# Patient Record
Sex: Female | Born: 1945
Health system: Southern US, Community
[De-identification: ages and names within clinical notes are randomized; demographics above are authoritative.]

## PROBLEM LIST (undated history)

## (undated) HISTORY — PX: TUBAL LIGATION: SHX77

## (undated) HISTORY — PX: BREAST BIOPSY: SHX20

## (undated) HISTORY — PX: KNEE ARTHROSCOPY: SUR90

---

## 2015-12-29 DIAGNOSIS — H8103 Meniere's disease, bilateral: Secondary | ICD-10-CM | POA: Insufficient documentation

## 2015-12-29 DIAGNOSIS — G43109 Migraine with aura, not intractable, without status migrainosus: Secondary | ICD-10-CM | POA: Insufficient documentation

## 2015-12-29 DIAGNOSIS — H903 Sensorineural hearing loss, bilateral: Secondary | ICD-10-CM | POA: Insufficient documentation

## 2015-12-29 DIAGNOSIS — G43809 Other migraine, not intractable, without status migrainosus: Secondary | ICD-10-CM | POA: Insufficient documentation

## 2016-10-03 DIAGNOSIS — H8103 Meniere's disease, bilateral: Secondary | ICD-10-CM | POA: Diagnosis not present

## 2016-10-03 DIAGNOSIS — H903 Sensorineural hearing loss, bilateral: Secondary | ICD-10-CM | POA: Diagnosis not present

## 2016-10-03 DIAGNOSIS — H905 Unspecified sensorineural hearing loss: Secondary | ICD-10-CM | POA: Diagnosis not present

## 2016-10-11 DIAGNOSIS — R42 Dizziness and giddiness: Secondary | ICD-10-CM | POA: Diagnosis not present

## 2016-10-16 DIAGNOSIS — H903 Sensorineural hearing loss, bilateral: Secondary | ICD-10-CM | POA: Diagnosis not present

## 2016-10-16 DIAGNOSIS — H8103 Meniere's disease, bilateral: Secondary | ICD-10-CM | POA: Diagnosis not present

## 2016-10-18 DIAGNOSIS — R42 Dizziness and giddiness: Secondary | ICD-10-CM | POA: Diagnosis not present

## 2016-10-25 DIAGNOSIS — R42 Dizziness and giddiness: Secondary | ICD-10-CM | POA: Diagnosis not present

## 2016-11-08 DIAGNOSIS — R42 Dizziness and giddiness: Secondary | ICD-10-CM | POA: Diagnosis not present

## 2016-11-15 DIAGNOSIS — R42 Dizziness and giddiness: Secondary | ICD-10-CM | POA: Diagnosis not present

## 2017-01-24 DIAGNOSIS — Z1231 Encounter for screening mammogram for malignant neoplasm of breast: Secondary | ICD-10-CM | POA: Diagnosis not present

## 2017-01-24 LAB — HM MAMMOGRAPHY

## 2017-02-06 DIAGNOSIS — L821 Other seborrheic keratosis: Secondary | ICD-10-CM | POA: Diagnosis not present

## 2017-02-06 DIAGNOSIS — X32XXXA Exposure to sunlight, initial encounter: Secondary | ICD-10-CM | POA: Diagnosis not present

## 2017-02-06 DIAGNOSIS — L812 Freckles: Secondary | ICD-10-CM | POA: Diagnosis not present

## 2017-02-06 DIAGNOSIS — L578 Other skin changes due to chronic exposure to nonionizing radiation: Secondary | ICD-10-CM | POA: Diagnosis not present

## 2017-02-26 DIAGNOSIS — Z139 Encounter for screening, unspecified: Secondary | ICD-10-CM | POA: Diagnosis not present

## 2017-02-26 DIAGNOSIS — Z0000 Encounter for general adult medical examination without abnormal findings: Secondary | ICD-10-CM | POA: Diagnosis not present

## 2017-02-26 DIAGNOSIS — F17201 Nicotine dependence, unspecified, in remission: Secondary | ICD-10-CM | POA: Diagnosis not present

## 2017-02-26 DIAGNOSIS — Z136 Encounter for screening for cardiovascular disorders: Secondary | ICD-10-CM | POA: Diagnosis not present

## 2017-02-26 DIAGNOSIS — Z1389 Encounter for screening for other disorder: Secondary | ICD-10-CM | POA: Diagnosis not present

## 2017-03-06 DIAGNOSIS — M9902 Segmental and somatic dysfunction of thoracic region: Secondary | ICD-10-CM | POA: Diagnosis not present

## 2017-03-06 DIAGNOSIS — M9903 Segmental and somatic dysfunction of lumbar region: Secondary | ICD-10-CM | POA: Diagnosis not present

## 2017-03-06 DIAGNOSIS — M9901 Segmental and somatic dysfunction of cervical region: Secondary | ICD-10-CM | POA: Diagnosis not present

## 2017-03-07 DIAGNOSIS — N941 Unspecified dyspareunia: Secondary | ICD-10-CM | POA: Diagnosis not present

## 2017-03-07 DIAGNOSIS — N898 Other specified noninflammatory disorders of vagina: Secondary | ICD-10-CM | POA: Diagnosis not present

## 2017-03-07 DIAGNOSIS — Z01419 Encounter for gynecological examination (general) (routine) without abnormal findings: Secondary | ICD-10-CM | POA: Diagnosis not present

## 2017-03-07 DIAGNOSIS — Z1239 Encounter for other screening for malignant neoplasm of breast: Secondary | ICD-10-CM | POA: Diagnosis not present

## 2017-03-08 DIAGNOSIS — Z01419 Encounter for gynecological examination (general) (routine) without abnormal findings: Secondary | ICD-10-CM | POA: Diagnosis not present

## 2017-03-08 DIAGNOSIS — R87615 Unsatisfactory cytologic smear of cervix: Secondary | ICD-10-CM | POA: Diagnosis not present

## 2017-03-25 DIAGNOSIS — H8103 Meniere's disease, bilateral: Secondary | ICD-10-CM | POA: Diagnosis not present

## 2017-03-28 DIAGNOSIS — M8589 Other specified disorders of bone density and structure, multiple sites: Secondary | ICD-10-CM | POA: Diagnosis not present

## 2017-03-28 DIAGNOSIS — H52223 Regular astigmatism, bilateral: Secondary | ICD-10-CM | POA: Diagnosis not present

## 2017-03-28 DIAGNOSIS — Z0000 Encounter for general adult medical examination without abnormal findings: Secondary | ICD-10-CM | POA: Diagnosis not present

## 2017-03-28 DIAGNOSIS — M810 Age-related osteoporosis without current pathological fracture: Secondary | ICD-10-CM | POA: Diagnosis not present

## 2017-03-28 DIAGNOSIS — Z136 Encounter for screening for cardiovascular disorders: Secondary | ICD-10-CM | POA: Diagnosis not present

## 2017-03-28 DIAGNOSIS — R935 Abnormal findings on diagnostic imaging of other abdominal regions, including retroperitoneum: Secondary | ICD-10-CM | POA: Diagnosis not present

## 2017-03-28 DIAGNOSIS — H2513 Age-related nuclear cataract, bilateral: Secondary | ICD-10-CM | POA: Diagnosis not present

## 2017-03-28 DIAGNOSIS — H524 Presbyopia: Secondary | ICD-10-CM | POA: Diagnosis not present

## 2017-03-28 DIAGNOSIS — H5202 Hypermetropia, left eye: Secondary | ICD-10-CM | POA: Diagnosis not present

## 2017-03-28 LAB — HM DEXA SCAN

## 2017-04-01 DIAGNOSIS — H905 Unspecified sensorineural hearing loss: Secondary | ICD-10-CM | POA: Diagnosis not present

## 2017-04-01 DIAGNOSIS — H8103 Meniere's disease, bilateral: Secondary | ICD-10-CM | POA: Diagnosis not present

## 2017-04-01 DIAGNOSIS — H903 Sensorineural hearing loss, bilateral: Secondary | ICD-10-CM | POA: Diagnosis not present

## 2017-04-12 DIAGNOSIS — H8103 Meniere's disease, bilateral: Secondary | ICD-10-CM | POA: Diagnosis not present

## 2017-06-18 DIAGNOSIS — H9190 Unspecified hearing loss, unspecified ear: Secondary | ICD-10-CM | POA: Diagnosis not present

## 2017-06-18 DIAGNOSIS — H8109 Meniere's disease, unspecified ear: Secondary | ICD-10-CM | POA: Diagnosis not present

## 2017-06-18 DIAGNOSIS — H8149 Vertigo of central origin, unspecified ear: Secondary | ICD-10-CM | POA: Diagnosis not present

## 2017-07-30 DIAGNOSIS — H903 Sensorineural hearing loss, bilateral: Secondary | ICD-10-CM | POA: Diagnosis not present

## 2017-07-30 DIAGNOSIS — H8103 Meniere's disease, bilateral: Secondary | ICD-10-CM | POA: Diagnosis not present

## 2017-07-30 DIAGNOSIS — R42 Dizziness and giddiness: Secondary | ICD-10-CM | POA: Diagnosis not present

## 2017-07-30 DIAGNOSIS — H818X3 Other disorders of vestibular function, bilateral: Secondary | ICD-10-CM | POA: Diagnosis not present

## 2018-01-03 ENCOUNTER — Ambulatory Visit (INDEPENDENT_AMBULATORY_CARE_PROVIDER_SITE_OTHER): Payer: Medicare Other | Admitting: Physician Assistant

## 2018-01-03 ENCOUNTER — Encounter: Payer: Self-pay | Admitting: Physician Assistant

## 2018-01-03 VITALS — BP 122/68 | HR 64 | Temp 98.1°F | Resp 16 | Ht 65.0 in | Wt 121.0 lb

## 2018-01-03 DIAGNOSIS — H8109 Meniere's disease, unspecified ear: Secondary | ICD-10-CM

## 2018-01-03 DIAGNOSIS — Z1231 Encounter for screening mammogram for malignant neoplasm of breast: Secondary | ICD-10-CM | POA: Diagnosis not present

## 2018-01-03 DIAGNOSIS — G43109 Migraine with aura, not intractable, without status migrainosus: Secondary | ICD-10-CM

## 2018-01-03 DIAGNOSIS — Z1239 Encounter for other screening for malignant neoplasm of breast: Secondary | ICD-10-CM

## 2018-01-03 DIAGNOSIS — G43809 Other migraine, not intractable, without status migrainosus: Secondary | ICD-10-CM

## 2018-01-03 NOTE — Progress Notes (Signed)
       Patient: Christina Parsons Female    DOB: [birth year]   72 y.o.   MRN: [id number] Visit Date: 01/09/2018  Today's Provider: Trinna Post, PA-C   Chief Complaint  Patient presents with  . Establish Care   Subjective:    HPI   Christina Parsons is a 72 y/o woman presenting today to establish care. She is originally from [place] but recently moved from [facility] [place] to [place].  She has had Meniere's disease for 11 years, previously treated at [facility] and now at [facility]. She is currently on Dyazide 37.5-25 mg for this with [facility]. She has previously undergone steroid injections and vestibular rehab.   She has had recent colonoscopy and DEXA scan. She reports she is due for a mammogram. She believes she had a pneumonia shot from prior PCP.     Allergies not on file   Current Outpatient Medications:  .  Biotin 1 MG CAPS, biotin, Disp: , Rfl:  .  triamterene-hydrochlorothiazide (DYAZIDE) 37.5-25 MG capsule, Take by mouth., Disp: , Rfl:   Review of Systems   12 point ROS reviewed and is negative except for pertinent info in HPI.  Social History   Tobacco Use  . Smoking status: Never Smoker  . Smokeless tobacco: Never Used  Substance Use Topics  . Alcohol use: Never    Frequency: Never   Objective:   BP 122/68 (BP Location: Right Arm, Patient Position: Sitting, Cuff Size: Normal)   Pulse 64   Temp 98.1 F (36.7 C) (Oral)   Resp 16   Ht 5\' 5"  (1.651 m)   Wt 121 lb (54.9 kg)   BMI 20.14 kg/m  Vitals:   01/03/18 1511  BP: 122/68  Pulse: 64  Resp: 16  Temp: 98.1 F (36.7 C)  TempSrc: Oral  Weight: 121 lb (54.9 kg)  Height: 5\' 5"  (1.651 m)     Physical Exam  Constitutional: She is oriented to person, place, and time. She appears well-developed and well-nourished.  Cardiovascular: Normal rate and regular rhythm.  Pulmonary/Chest: Effort normal and breath sounds normal.  Abdominal: Soft. Bowel sounds are normal.  Neurological: She is alert and oriented to person,  place, and time.  Skin: Skin is warm and dry.  Psychiatric: She has a normal mood and affect. Her behavior is normal.        Assessment & Plan:     1. Breast cancer screening  Requesting records from previous PCP regarding colonoscopy, DEXA, and PNA shot.   - MM Digital Screening; Future  2. Vestibular migraine  Treated by [facility] for Meniere's.  Return in about 1 year (around 01/04/2019) for AWV.  The entirety of the information documented in the History of Present Illness, Review of Systems and Physical Exam were personally obtained by me. Portions of this information were initially documented by Ashley Royalty, CMA and reviewed by me for thoroughness and accuracy.        Trinna Post, PA-C  [facility]

## 2018-01-03 NOTE — Patient Instructions (Signed)

## 2018-02-03 ENCOUNTER — Encounter (HOSPITAL_COMMUNITY): Payer: Self-pay

## 2018-02-03 ENCOUNTER — Ambulatory Visit
Admission: RE | Admit: 2018-02-03 | Discharge: 2018-02-03 | Disposition: A | Discharge status: Home / Self Care | Payer: Medicare Other | Source: Ambulatory Visit | Attending: Physician Assistant | Admitting: Physician Assistant

## 2018-02-03 DIAGNOSIS — Z1239 Encounter for other screening for malignant neoplasm of breast: Secondary | ICD-10-CM

## 2018-02-03 DIAGNOSIS — Z1231 Encounter for screening mammogram for malignant neoplasm of breast: Secondary | ICD-10-CM | POA: Insufficient documentation

## 2018-02-18 DIAGNOSIS — H8103 Meniere's disease, bilateral: Secondary | ICD-10-CM | POA: Diagnosis not present

## 2018-02-18 DIAGNOSIS — H903 Sensorineural hearing loss, bilateral: Secondary | ICD-10-CM | POA: Diagnosis not present

## 2018-02-20 ENCOUNTER — Encounter: Payer: Self-pay | Admitting: Physician Assistant

## 2018-03-03 DIAGNOSIS — L814 Other melanin hyperpigmentation: Secondary | ICD-10-CM | POA: Diagnosis not present

## 2018-03-03 DIAGNOSIS — L578 Other skin changes due to chronic exposure to nonionizing radiation: Secondary | ICD-10-CM | POA: Diagnosis not present

## 2018-03-03 DIAGNOSIS — L570 Actinic keratosis: Secondary | ICD-10-CM | POA: Diagnosis not present

## 2018-03-03 DIAGNOSIS — L812 Freckles: Secondary | ICD-10-CM | POA: Diagnosis not present

## 2018-03-03 DIAGNOSIS — X32XXXA Exposure to sunlight, initial encounter: Secondary | ICD-10-CM | POA: Diagnosis not present

## 2018-03-06 ENCOUNTER — Ambulatory Visit (INDEPENDENT_AMBULATORY_CARE_PROVIDER_SITE_OTHER): Payer: Medicare Other | Admitting: Physician Assistant

## 2018-03-06 ENCOUNTER — Encounter: Payer: Self-pay | Admitting: Physician Assistant

## 2018-03-06 VITALS — BP 112/60 | HR 73 | Temp 97.7°F | Resp 16 | Wt 121.0 lb

## 2018-03-06 DIAGNOSIS — J0140 Acute pansinusitis, unspecified: Secondary | ICD-10-CM | POA: Diagnosis not present

## 2018-03-06 MED ORDER — AZITHROMYCIN 250 MG PO TABS: ORAL_TABLET | ORAL | 0 refills | Status: DC

## 2018-03-06 NOTE — Progress Notes (Signed)
Patient: Christina Parsons Female    DOB: 1945/10/07   72 y.o.   MRN: [id number] Visit Date: 03/06/2018  Today's Provider: Mar Daring, PA-C   Chief Complaint  Patient presents with  . Cough   Subjective:    Cough  This is a new problem. The current episode started 1 to 4 weeks ago. The problem has been gradually worsening. The problem occurs constantly. The cough is non-productive ("Dry cough"). Associated symptoms include postnasal drip. Pertinent negatives include no chills, ear congestion, ear pain, fever, nasal congestion, rhinorrhea, shortness of breath or wheezing. The symptoms are aggravated by lying down (Talking). She has tried OTC cough suppressant (nasal decongestant,Claritin,Mucinex.) for the symptoms. The treatment provided no relief.      Allergies  Allergen Reactions  . Levofloxacin     Other reaction(s): Other (See Comments) Headache, body aches, diarrhea Headache, body aches, diarrhea   . Codeine     Other reaction(s): Dizziness, Dizziness (intolerance) Vertigo Vertigo      Current Outpatient Medications:  .  Biotin 1 MG CAPS, biotin, Disp: , Rfl:  .  Calcium-Magnesium-Vitamin D (CALCIUM 500 PO), Take by mouth daily. , Disp: , Rfl:  .  Multiple Vitamins-Minerals (OCUVITE PO), Take by mouth., Disp: , Rfl:  .  triamterene-hydrochlorothiazide (DYAZIDE) 37.5-25 MG capsule, Take by mouth., Disp: , Rfl:   Review of Systems  Constitutional: Negative for chills and fever.  HENT: Positive for congestion and postnasal drip. Negative for ear pain and rhinorrhea.   Respiratory: Positive for cough. Negative for shortness of breath and wheezing.   Gastrointestinal: Negative.   Neurological: Negative.     Social History   Tobacco Use  . Smoking status: Never Smoker  . Smokeless tobacco: Never Used  Substance Use Topics  . Alcohol use: Never    Frequency: Never   Objective:   BP 112/60 (BP Location: Left Arm, Patient Position: Sitting, Cuff Size:  Normal)   Pulse 73   Temp 97.7 F (36.5 C) (Oral)   Resp 16   Wt 121 lb (54.9 kg)   SpO2 96%   BMI 20.14 kg/m    Physical Exam  Constitutional: She appears well-developed and well-nourished. No distress.  HENT:  Head: Normocephalic and atraumatic.  Right Ear: Hearing, tympanic membrane, external ear and ear canal normal.  Left Ear: Hearing, tympanic membrane, external ear and ear canal normal.  Nose: Right sinus exhibits maxillary sinus tenderness and frontal sinus tenderness. Left sinus exhibits maxillary sinus tenderness and frontal sinus tenderness.  Mouth/Throat: Uvula is midline, oropharynx is clear and moist and mucous membranes are normal. No oropharyngeal exudate.  Neck: Normal range of motion. Neck supple. No tracheal deviation present. No thyromegaly present.  Cardiovascular: Normal rate, regular rhythm and normal heart sounds. Exam reveals no gallop and no friction rub.  No murmur heard. Pulmonary/Chest: Effort normal and breath sounds normal. No stridor. No respiratory distress. She has no wheezes. She has no rales.  Lymphadenopathy:    She has no cervical adenopathy.  Skin: She is not diaphoretic.  Vitals reviewed.      Assessment & Plan:     1. Acute pansinusitis, recurrence not specified Worsening symptoms that have not responded to OTC medications. Will give Zpak as below. Continue allergy medications. Stay well hydrated and get plenty of rest. Call if no symptom improvement or if symptoms worsen. - azithromycin (ZITHROMAX) 250 MG tablet; Take 2 tablets PO on day one, and one tablet PO daily thereafter until  completed.  Dispense: 6 tablet; Refill: 0       Mar Daring, PA-C  [facility]

## 2018-03-06 NOTE — Patient Instructions (Signed)

## 2018-06-13 DIAGNOSIS — H2513 Age-related nuclear cataract, bilateral: Secondary | ICD-10-CM | POA: Diagnosis not present

## 2018-09-06 IMAGING — MG MM DIGITAL SCREENING BILAT W/ CAD
4 series · 4 of 4 positions shown · non-contrast
Comparison: None.

CLINICAL DATA: Screening.

EXAM:
DIGITAL SCREENING BILATERAL MAMMOGRAM WITH CAD

[L MLO]
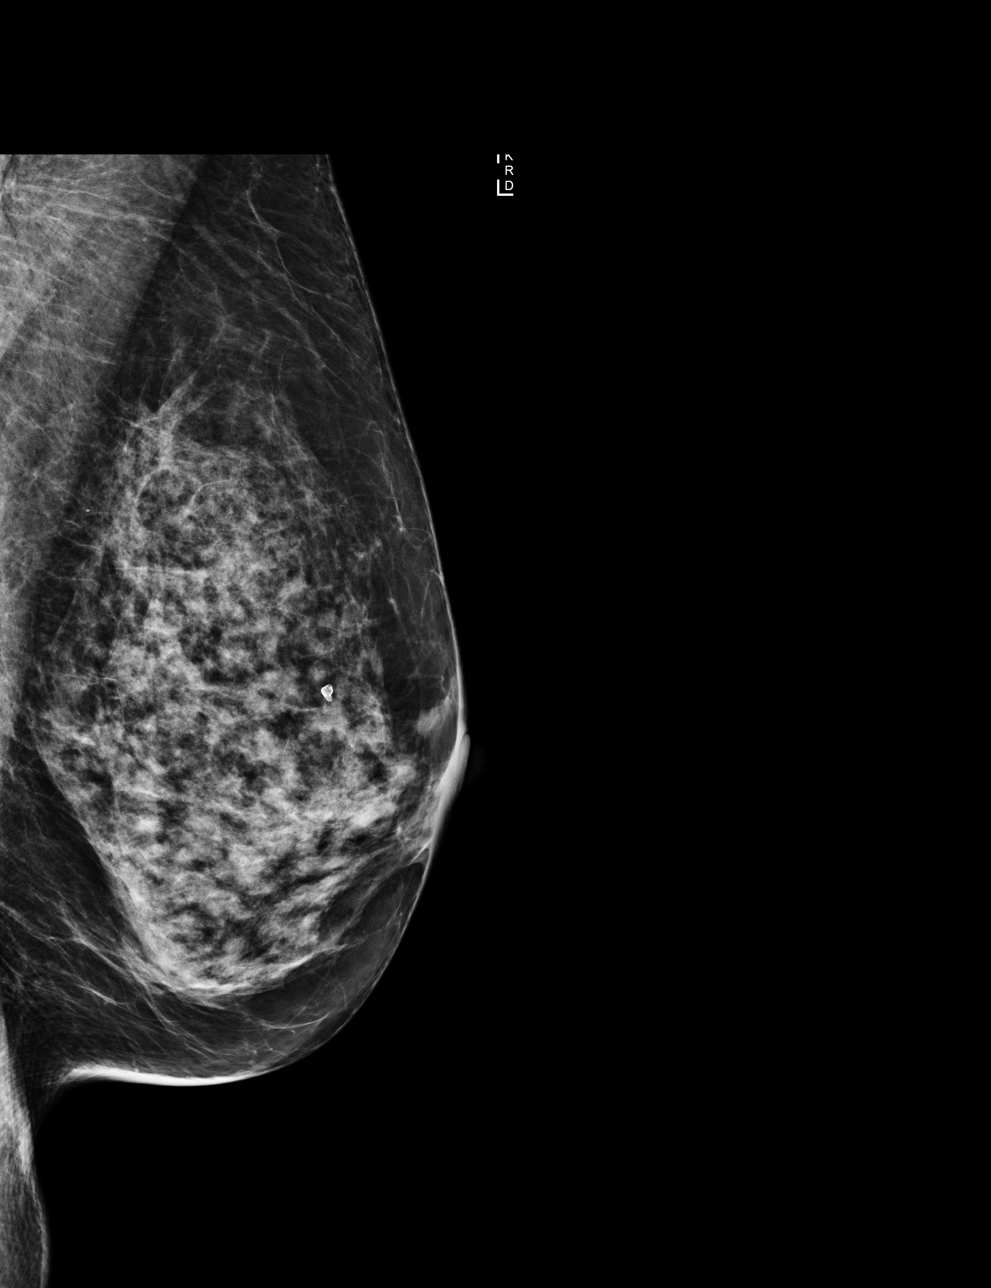

[R MLO]
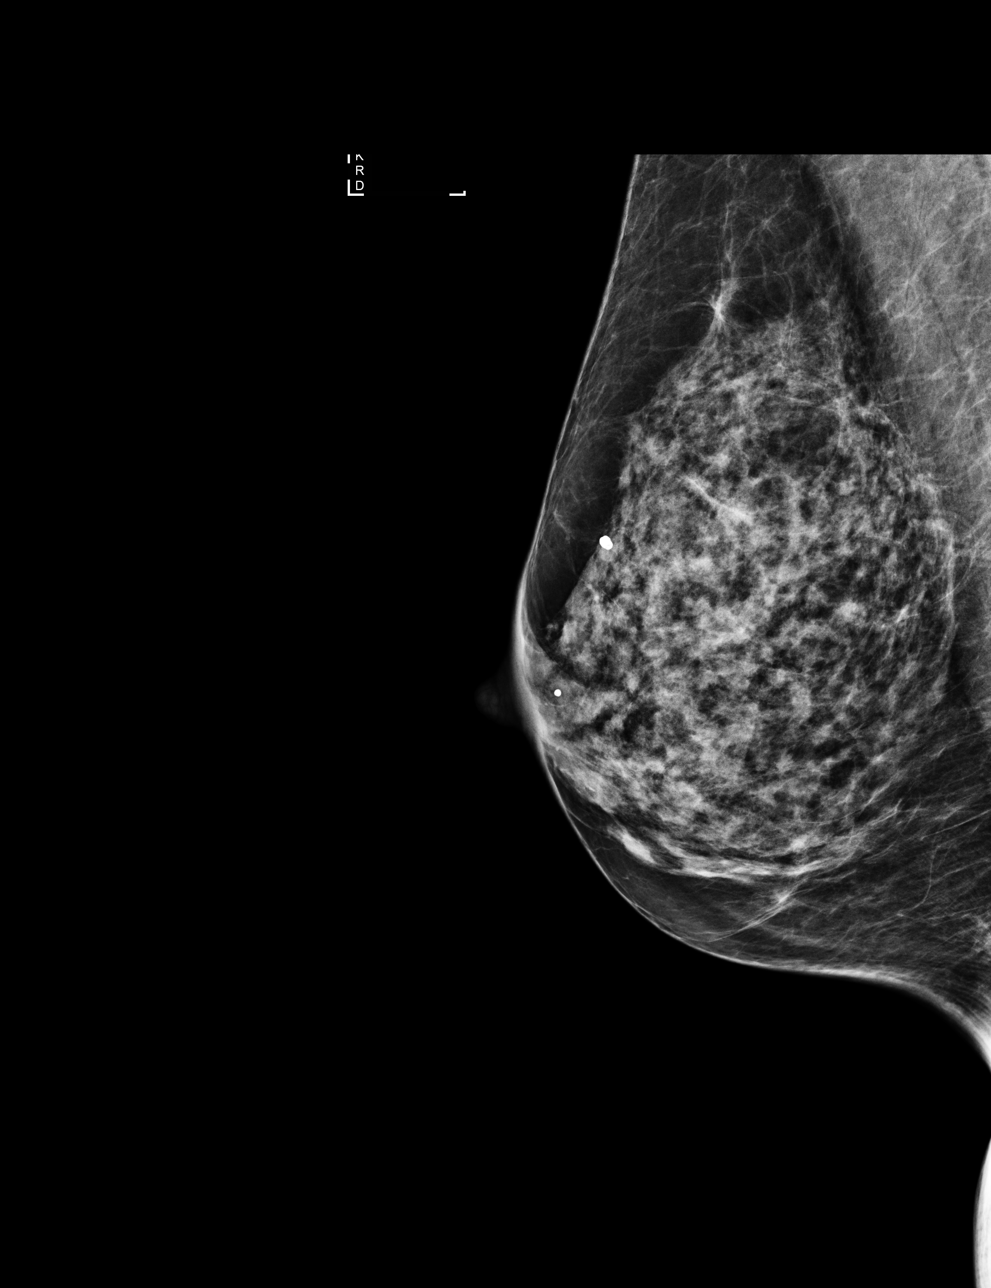

[L CC]
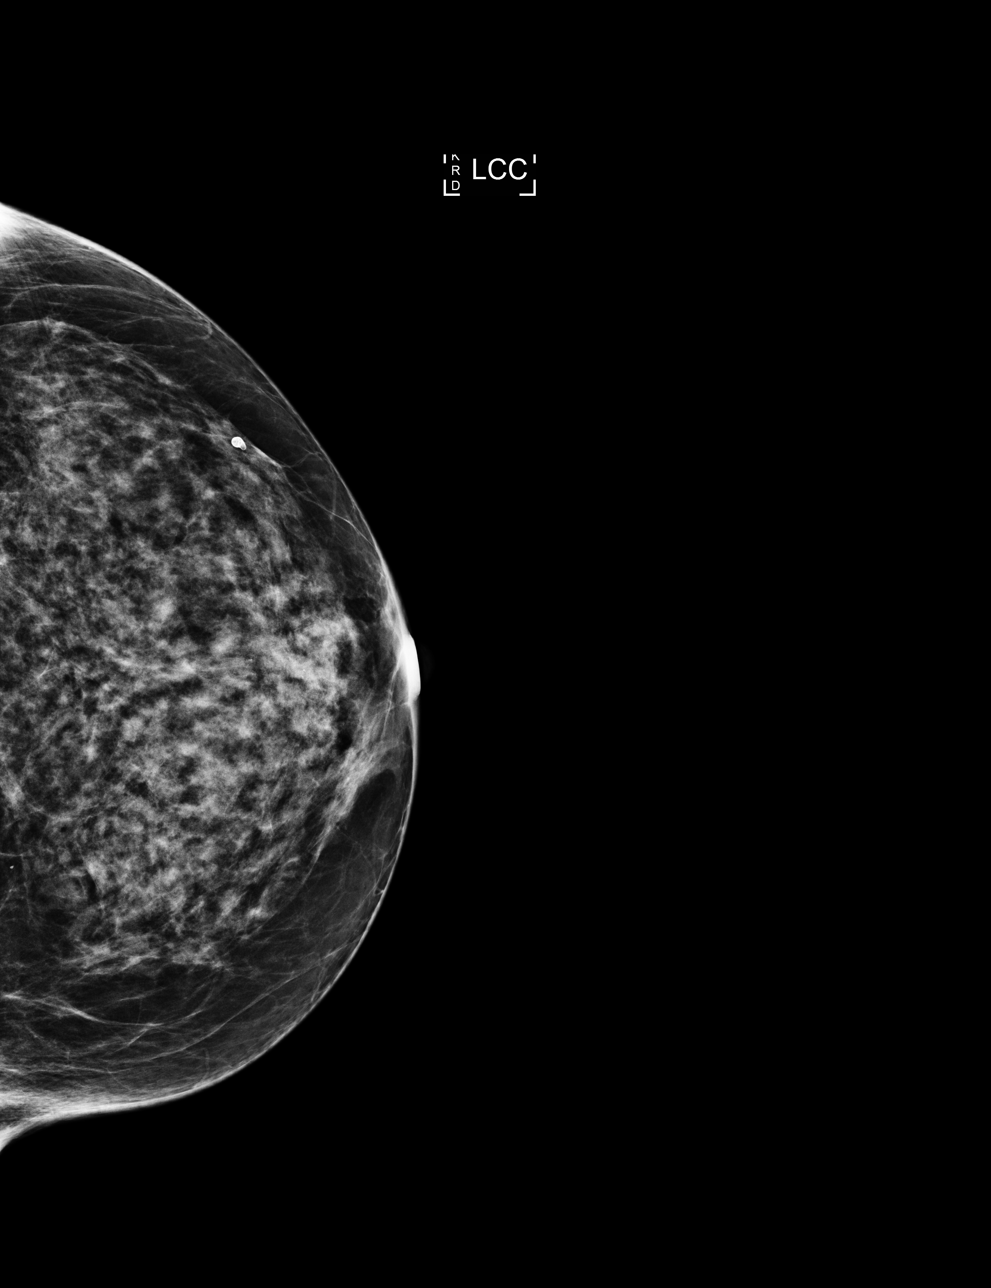

[R CC]
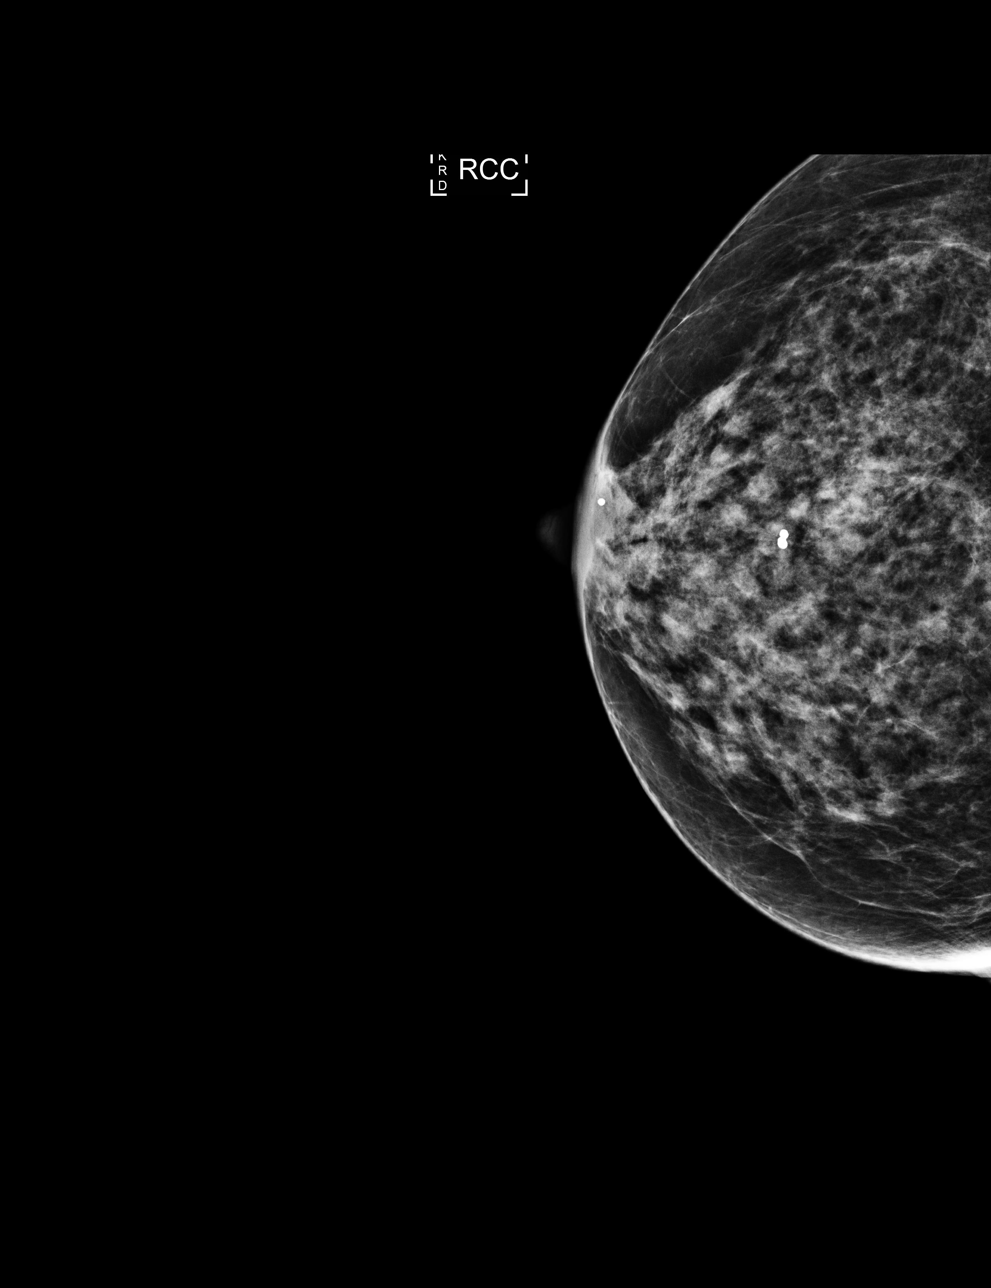

[4 of 4 positions shown; findings below may reference images not displayed]

ACR Breast Density Category c: The breast tissue is heterogeneously
dense, which may obscure small masses
FINDINGS: There are no findings suspicious for malignancy. Images were
processed with CAD.
IMPRESSION: No mammographic evidence of malignancy. A result letter of this
screening mammogram will be mailed directly to the patient.

RECOMMENDATION:
Screening mammogram in one year. (Code:U2-0-761)

BI-RADS CATEGORY  1: Negative.

## 2018-11-18 ENCOUNTER — Ambulatory Visit (INDEPENDENT_AMBULATORY_CARE_PROVIDER_SITE_OTHER): Payer: Medicare Other | Admitting: Physician Assistant

## 2018-11-18 ENCOUNTER — Encounter: Payer: Self-pay | Admitting: Physician Assistant

## 2018-11-18 VITALS — BP 113/72 | HR 82 | Temp 98.2°F | Wt 122.6 lb

## 2018-11-18 DIAGNOSIS — R350 Frequency of micturition: Secondary | ICD-10-CM

## 2018-11-18 DIAGNOSIS — R300 Dysuria: Secondary | ICD-10-CM | POA: Diagnosis not present

## 2018-11-18 DIAGNOSIS — R3989 Other symptoms and signs involving the genitourinary system: Secondary | ICD-10-CM | POA: Diagnosis not present

## 2018-11-18 LAB — POCT URINALYSIS DIPSTICK
Bilirubin, UA: NEGATIVE
Blood, UA: NEGATIVE
Glucose, UA: NEGATIVE
Ketones, UA: NEGATIVE
Leukocytes, UA: NEGATIVE
Nitrite, UA: NEGATIVE
Protein, UA: NEGATIVE
Spec Grav, UA: <=1.005 — AB (ref 1.010–1.025)
Urobilinogen, UA: 0.2 E.U./dL
pH, UA: 7.5 (ref 5.0–8.0)

## 2018-11-18 NOTE — Progress Notes (Signed)
Patient: Christina Parsons Female    DOB: Apr 26, 1946   73 y.o.   MRN: [id number] Visit Date: 11/18/2018  Today's Provider: Trinna Post, PA-C   Chief Complaint  Patient presents with  . Dysuria   Subjective:     Dysuria   The current episode started 1 to 4 weeks ago. The problem occurs every urination. The problem has been gradually improving. The pain is at a severity of 0/10. There has been no fever. Associated symptoms include urgency. She has tried nothing for the symptoms.  Patient states that her urine was cloudy last week but has cleared up.  Allergies  Allergen Reactions  . Levofloxacin     Other reaction(s): Other (See Comments) Headache, body aches, diarrhea Headache, body aches, diarrhea   . Codeine     Other reaction(s): Dizziness, Dizziness (intolerance) Vertigo Vertigo      Current Outpatient Medications:  .  Biotin 1 MG CAPS, biotin, Disp: , Rfl:  .  Calcium-Magnesium-Vitamin D (CALCIUM 500 PO), Take by mouth daily. , Disp: , Rfl:  .  Multiple Vitamins-Minerals (OCUVITE PO), Take by mouth., Disp: , Rfl:  .  triamterene-hydrochlorothiazide (DYAZIDE) 37.5-25 MG capsule, Take by mouth., Disp: , Rfl:  .  azithromycin (ZITHROMAX) 250 MG tablet, Take 2 tablets PO on day one, and one tablet PO daily thereafter until completed. (Patient not taking: Reported on 11/18/2018), Disp: 6 tablet, Rfl: 0  Review of Systems  HENT: Negative.   Respiratory: Negative.   Endocrine: Negative.   Genitourinary: Positive for dysuria and urgency.  Skin: Negative.   Neurological: Negative.     Social History   Tobacco Use  . Smoking status: Never Smoker  . Smokeless tobacco: Never Used  Substance Use Topics  . Alcohol use: Never    Frequency: Never      Objective:   BP 113/72 (BP Location: Left Arm, Patient Position: Sitting, Cuff Size: Normal)   Pulse 82   Temp 98.2 F (36.8 C) (Oral)   Wt 122 lb 9.6 oz (55.6 kg)   SpO2 96%   BMI 20.40 kg/m  Vitals:   11/18/18 1436  BP: 113/72  Pulse: 82  Temp: 98.2 F (36.8 C)  TempSrc: Oral  SpO2: 96%  Weight: 122 lb 9.6 oz (55.6 kg)     Physical Exam Constitutional:      General: She is not in acute distress.    Appearance: She is well-developed. She is not diaphoretic.  Cardiovascular:     Rate and Rhythm: Normal rate and regular rhythm.  Pulmonary:     Effort: Pulmonary effort is normal.     Breath sounds: Normal breath sounds.  Abdominal:     General: Bowel sounds are normal. There is no distension.     Palpations: Abdomen is soft.     Tenderness: There is no abdominal tenderness. There is no guarding or rebound.  Skin:    General: Skin is warm and dry.  Neurological:     Mental Status: She is alert and oriented to person, place, and time.  Psychiatric:        Behavior: Behavior normal.         Assessment & Plan    1. Possible urinary tract infection  Specific gravity very low, no nitrites or leuks. Very dilute. Will send for culture, defer antibiotics until then.   - POCT urinalysis dipstick  2. Dysuria  - CULTURE, URINE COMPREHENSIVE  3. Urinary frequency  The entirety of  the information documented in the History of Present Illness, Review of Systems and Physical Exam were personally obtained by me. Portions of this information were initially documented by [facility], CMA and reviewed by me for thoroughness and accuracy.   Return if symptoms worsen or fail to improve.         Trinna Post, PA-C  [facility]

## 2018-11-18 NOTE — Patient Instructions (Signed)

## 2018-11-20 ENCOUNTER — Telehealth: Payer: Self-pay | Admitting: Physician Assistant

## 2018-11-20 DIAGNOSIS — N3090 Cystitis, unspecified without hematuria: Secondary | ICD-10-CM

## 2018-11-20 MED ORDER — SULFAMETHOXAZOLE-TRIMETHOPRIM 800-160 MG PO TABS: 1.0000 | ORAL_TABLET | Freq: Two times a day (BID) | ORAL | 0 refills | Status: AC

## 2018-11-20 NOTE — Telephone Encounter (Signed)
Pt was in Tuesday with what she thought was a uti.  She is still having symptoms and would like an antibiotic sent to the pharmacy  [place]   CB#  [phone]  Thanks Con Memos

## 2018-11-21 ENCOUNTER — Telehealth: Payer: Self-pay

## 2018-11-21 LAB — CULTURE, URINE COMPREHENSIVE

## 2018-11-21 NOTE — Telephone Encounter (Signed)
Patient advised as below.  

## 2018-11-21 NOTE — Telephone Encounter (Signed)
[email or web address]

## 2018-11-21 NOTE — Telephone Encounter (Signed)
Patient advised as below. Patient verbalizes understanding and is in agreement with treatment plan.  

## 2018-11-21 NOTE — Telephone Encounter (Signed)
-----   Message from Trinna Post, [place] sent at 11/21/2018  1:57 PM EST ----- Urine culture shows e coli sensitive to bactrim prescribed. Please finish abx.

## 2019-03-10 DIAGNOSIS — H903 Sensorineural hearing loss, bilateral: Secondary | ICD-10-CM | POA: Diagnosis not present

## 2019-03-10 DIAGNOSIS — R42 Dizziness and giddiness: Secondary | ICD-10-CM | POA: Diagnosis not present

## 2019-03-10 DIAGNOSIS — H8103 Meniere's disease, bilateral: Secondary | ICD-10-CM | POA: Diagnosis not present

## 2019-03-10 DIAGNOSIS — H9313 Tinnitus, bilateral: Secondary | ICD-10-CM | POA: Diagnosis not present

## 2019-03-19 ENCOUNTER — Other Ambulatory Visit: Payer: Self-pay

## 2019-03-19 ENCOUNTER — Ambulatory Visit (INDEPENDENT_AMBULATORY_CARE_PROVIDER_SITE_OTHER): Payer: Medicare Other | Admitting: Physician Assistant

## 2019-03-19 ENCOUNTER — Encounter: Payer: Self-pay | Admitting: Physician Assistant

## 2019-03-19 VITALS — BP 101/59 | HR 54 | Temp 98.2°F | Resp 16 | Ht 65.0 in | Wt 120.0 lb

## 2019-03-19 DIAGNOSIS — Z1239 Encounter for other screening for malignant neoplasm of breast: Secondary | ICD-10-CM

## 2019-03-19 DIAGNOSIS — G43109 Migraine with aura, not intractable, without status migrainosus: Secondary | ICD-10-CM

## 2019-03-19 DIAGNOSIS — H903 Sensorineural hearing loss, bilateral: Secondary | ICD-10-CM

## 2019-03-19 NOTE — Patient Instructions (Signed)

## 2019-03-19 NOTE — Progress Notes (Signed)
Patient: Christina Parsons, Female    DOB: Nov 13, 1945, 73 y.o.   MRN: [id number] Visit Date: 03/19/2019  Today's Provider: Trinna Post, PA-C   Chief Complaint  Patient presents with  . Medicare Wellness   Subjective:     Annual physical exam Reese L Glasser is a 73 y.o. female who presents today for health maintenance and complete physical. She feels well. She reports exercising daily. She reports she is sleeping well.  She is planning to move back to [facility], where she originally moved from. Daughter just got job at [employer].  Continues to have Meniere's disease and is maintained on diuretic.   Due for mammogram,  02/20/2018 mammogram.   She reports colonoscopy in [facility], thought we do not have records available.  -----------------------------------------------------------------   Review of Systems  Constitutional: Negative.   HENT: Positive for hearing loss and tinnitus.   Eyes: Negative.   Respiratory: Negative.   Cardiovascular: Negative.   Gastrointestinal: Negative.   Endocrine: Negative.   Genitourinary: Negative.   Musculoskeletal: Negative.   Skin: Negative.   Allergic/Immunologic: Negative.   Neurological: Negative.   Hematological: Negative.   Psychiatric/Behavioral: Negative.     Social History      She  reports that she has never smoked. She has never used smokeless tobacco. She reports that she does not drink alcohol or use drugs.       Social History   Socioeconomic History  . Marital status: Divorced    Spouse name: Not on file  . Number of children: Not on file  . Years of education: Not on file  . Highest education level: Not on file  Occupational History  . Not on file  Social Needs  . Financial resource strain: Not on file  . Food insecurity    Worry: Not on file    Inability: Not on file  . Transportation needs    Medical: Not on file    Non-medical: Not on file  Tobacco Use  . Smoking status: Never Smoker  . Smokeless  tobacco: Never Used  Substance and Sexual Activity  . Alcohol use: Never    Frequency: Never  . Drug use: Never  . Sexual activity: Never  Lifestyle  . Physical activity    Days per week: Not on file    Minutes per session: Not on file  . Stress: Not on file  Relationships  . Social Herbalist on phone: Not on file    Gets together: Not on file    Attends religious service: Not on file    Active member of club or organization: Not on file    Attends meetings of clubs or organizations: Not on file    Relationship status: Not on file  Other Topics Concern  . Not on file  Social History Narrative  . Not on file    No past medical history on file.   Patient Active Problem List   Diagnosis Date Noted  . 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 Migraine with vertigo 12/29/2015  . Sensorineural hearing loss of both ears 12/29/2015  . Vestibular migraine 12/29/2015    Past Surgical History:  Procedure Laterality Date  . BREAST BIOPSY Left    core- neg  . KNEE ARTHROSCOPY Right   . TUBAL LIGATION      Family History        Family Status  Relation Name Status  . Mother  Alive  .  Father  Deceased at age 57  . Neg Hx  (Not Specified)        Her family history includes Alzheimer's disease in her father; Cataracts in her mother; Hypertension in her mother. There is no history of Breast cancer.      Allergies  Allergen Reactions  . Levofloxacin     Other reaction(s): Other (See Comments) Headache, body aches, diarrhea Headache, body aches, diarrhea   . Codeine     Other reaction(s): Dizziness, Dizziness (intolerance) Vertigo Vertigo      Current Outpatient Medications:  .  Biotin 1 MG CAPS, biotin, Disp: , Rfl:  .  Calcium-Magnesium-Vitamin D (CALCIUM 500 PO), Take by mouth daily. , Disp: , Rfl:  .  Multiple Vitamins-Minerals (OCUVITE EXTRA PO), Take by mouth., Disp: , Rfl:  .  triamterene-hydrochlorothiazide (DYAZIDE) 37.5-25 MG capsule,  Take by mouth., Disp: , Rfl:    Patient Care Team: Paulene Floor as PCP - General (Physician Assistant)    Objective:    Vitals: BP (!) 101/59 (BP Location: Left Arm, Patient Position: Sitting, Cuff Size: Normal)   Pulse (!) 54   Temp 98.2 F (36.8 C) (Oral)   Resp 16   Ht 5\' 5"  (1.651 m)   Wt 120 lb (54.4 kg)   BMI 19.97 kg/m    Vitals:   03/19/19 1504  BP: (!) 101/59  Pulse: (!) 54  Resp: 16  Temp: 98.2 F (36.8 C)  TempSrc: Oral  Weight: 120 lb (54.4 kg)  Height: 5\' 5"  (1.651 m)     Physical Exam   Depression Screen PHQ 2/9 Scores 01/03/2018 01/03/2018  PHQ - 2 Score 0 0  PHQ- 9 Score 0 -       Assessment & Plan:     Routine Health Maintenance and Physical Exam  Exercise Activities and Dietary recommendations Goals   None      There is no immunization history on file for this patient.  Health Maintenance  Topic Date Due  . Hepatitis C Screening  28-May-1946  . TETANUS/TDAP  08/25/1967  . COLONOSCOPY  08/24/1998  . PNA vac Low Risk Adult (1 of 2 - PCV13) 08/24/2013  . INFLUENZA VACCINE  04/25/2019  . MAMMOGRAM  02/04/2020  . DEXA SCAN  Completed     Discussed health benefits of physical activity, and encouraged her to engage in regular exercise appropriate for her age and condition.    1. Breast cancer screening  - MM Digital Screening; Future  2. Migraine with vertigo   3. Sensorineural hearing loss of both ears  Followed by [facility].  The entirety of the information documented in the History of Present Illness, Review of Systems and Physical Exam were personally obtained by me. Portions of this information were initially documented by Lynford Humphrey, CMA and reviewed by me for thoroughness and accuracy.   F/u PRN --------------------------------------------------------------------    Trinna Post, PA-C  [facility]

## 2019-03-25 ENCOUNTER — Other Ambulatory Visit: Payer: Self-pay | Admitting: Physician Assistant

## 2019-03-25 DIAGNOSIS — Z1231 Encounter for screening mammogram for malignant neoplasm of breast: Secondary | ICD-10-CM

## 2019-05-04 ENCOUNTER — Ambulatory Visit
Admission: RE | Admit: 2019-05-04 | Discharge: 2019-05-04 | Disposition: A | Discharge status: Home / Self Care | Payer: Medicare Other | Source: Ambulatory Visit | Attending: Physician Assistant | Admitting: Physician Assistant

## 2019-05-04 ENCOUNTER — Other Ambulatory Visit: Payer: Self-pay

## 2019-05-04 DIAGNOSIS — Z1231 Encounter for screening mammogram for malignant neoplasm of breast: Secondary | ICD-10-CM | POA: Diagnosis not present

## 2019-06-08 DIAGNOSIS — C44319 Basal cell carcinoma of skin of other parts of face: Secondary | ICD-10-CM | POA: Diagnosis not present

## 2019-06-08 DIAGNOSIS — D485 Neoplasm of uncertain behavior of skin: Secondary | ICD-10-CM | POA: Diagnosis not present

## 2019-06-08 DIAGNOSIS — D225 Melanocytic nevi of trunk: Secondary | ICD-10-CM | POA: Diagnosis not present

## 2019-06-08 DIAGNOSIS — L821 Other seborrheic keratosis: Secondary | ICD-10-CM | POA: Diagnosis not present

## 2019-06-08 DIAGNOSIS — D2272 Melanocytic nevi of left lower limb, including hip: Secondary | ICD-10-CM | POA: Diagnosis not present

## 2019-06-08 DIAGNOSIS — D2271 Melanocytic nevi of right lower limb, including hip: Secondary | ICD-10-CM | POA: Diagnosis not present

## 2019-06-08 DIAGNOSIS — D2262 Melanocytic nevi of left upper limb, including shoulder: Secondary | ICD-10-CM | POA: Diagnosis not present

## 2019-08-10 DIAGNOSIS — H2513 Age-related nuclear cataract, bilateral: Secondary | ICD-10-CM | POA: Diagnosis not present

## 2019-08-10 DIAGNOSIS — H5203 Hypermetropia, bilateral: Secondary | ICD-10-CM | POA: Diagnosis not present

## 2019-08-10 DIAGNOSIS — H52223 Regular astigmatism, bilateral: Secondary | ICD-10-CM | POA: Diagnosis not present

## 2019-08-10 DIAGNOSIS — H524 Presbyopia: Secondary | ICD-10-CM | POA: Diagnosis not present

## 2019-08-27 DIAGNOSIS — C441122 Basal cell carcinoma of skin of right lower eyelid, including canthus: Secondary | ICD-10-CM | POA: Diagnosis not present

## 2019-11-09 DIAGNOSIS — Z85828 Personal history of other malignant neoplasm of skin: Secondary | ICD-10-CM | POA: Diagnosis not present

## 2019-11-09 DIAGNOSIS — L570 Actinic keratosis: Secondary | ICD-10-CM | POA: Diagnosis not present

## 2019-11-09 DIAGNOSIS — L821 Other seborrheic keratosis: Secondary | ICD-10-CM | POA: Diagnosis not present

## 2019-11-09 DIAGNOSIS — D485 Neoplasm of uncertain behavior of skin: Secondary | ICD-10-CM | POA: Diagnosis not present

## 2019-11-09 DIAGNOSIS — Z08 Encounter for follow-up examination after completed treatment for malignant neoplasm: Secondary | ICD-10-CM | POA: Diagnosis not present

## 2019-11-09 DIAGNOSIS — X32XXXA Exposure to sunlight, initial encounter: Secondary | ICD-10-CM | POA: Diagnosis not present

## 2019-11-09 DIAGNOSIS — D045 Carcinoma in situ of skin of trunk: Secondary | ICD-10-CM | POA: Diagnosis not present

## 2019-11-17 DIAGNOSIS — D045 Carcinoma in situ of skin of trunk: Secondary | ICD-10-CM | POA: Diagnosis not present

## 2019-11-26 ENCOUNTER — Telehealth: Payer: Self-pay

## 2019-11-26 NOTE — Telephone Encounter (Signed)
Copied from [place] [id number]. Topic: General - Other >> Nov 26, 2019  4:25 PM Leward Quan A wrote: Reason for CRM: Patient called to inform Carles Collet that she need a referral for [facility] Ph# [phone] Asking for this to be placed as soon as possible Ph# [phone]

## 2019-11-27 ENCOUNTER — Other Ambulatory Visit: Payer: Self-pay

## 2019-11-27 ENCOUNTER — Ambulatory Visit (INDEPENDENT_AMBULATORY_CARE_PROVIDER_SITE_OTHER): Payer: Medicare Other | Admitting: Physician Assistant

## 2019-11-27 ENCOUNTER — Encounter: Payer: Self-pay | Admitting: Physician Assistant

## 2019-11-27 VITALS — BP 115/73 | HR 60 | Temp 96.8°F | Wt 117.6 lb

## 2019-11-27 DIAGNOSIS — R1013 Epigastric pain: Secondary | ICD-10-CM | POA: Diagnosis not present

## 2019-11-27 MED ORDER — FAMOTIDINE 20 MG PO TABS: 20.0000 mg | ORAL_TABLET | Freq: Two times a day (BID) | ORAL | 0 refills | Status: DC

## 2019-11-27 NOTE — Telephone Encounter (Signed)
Correct, needs evaluation with appointment.

## 2019-11-27 NOTE — Progress Notes (Signed)
Patient: Christina Parsons Female    DOB: 10/25/1945   74 y.o.   MRN: [id number] Visit Date: 11/27/2019  Today's Provider: Trinna Post, PA-C   Chief Complaint  Patient presents with  . Referral   Subjective:     HPI  Referral Patient presents today for a referral placement to gastroenterology. Patient states that she has been having chest pains, belching and burping for about 1 month now. Patient states it wakes her up out her sleep at like 4 am in the morning. Reports she has been getting chest pain at 4 in the morning that would wake her up. She endorses nausea but not vomiting. Some days she would have no appetite. Yesterday particularly bad day with the pain. No history of gallstones, not related to eating. No diarrhea. She has tried famotidine of unknown dose. She does not take these consistently. She took it before she went to bed.   No chest pain with activity or radiation of pain to her arm. No SOB.   Wt Readings from Last 3 Encounters:  11/27/19 117 lb 9.6 oz (53.3 kg)  03/19/19 120 lb (54.4 kg)  11/18/18 122 lb 9.6 oz (55.6 kg)    Allergies  Allergen Reactions  . Levofloxacin     Other reaction(s): Other (See Comments) Headache, body aches, diarrhea Headache, body aches, diarrhea   . Codeine     Other reaction(s): Dizziness, Dizziness (intolerance) Vertigo Vertigo      Current Outpatient Medications:  .  Biotin 1 MG CAPS, biotin, Disp: , Rfl:  .  Calcium-Magnesium-Vitamin D (CALCIUM 500 PO), Take by mouth daily. , Disp: , Rfl:  .  Multiple Vitamins-Minerals (OCUVITE EXTRA PO), Take by mouth., Disp: , Rfl:  .  triamterene-hydrochlorothiazide (DYAZIDE) 37.5-25 MG capsule, Take by mouth., Disp: , Rfl:   Review of Systems  Social History   Tobacco Use  . Smoking status: Never Smoker  . Smokeless tobacco: Never Used  Substance Use Topics  . Alcohol use: Never      Objective:   BP 115/73 (BP Location: Left Arm, Patient Position: Sitting, Cuff  Size: Normal)   Pulse 60   Temp (!) 96.8 F (36 C) (Temporal)   Wt 117 lb 9.6 oz (53.3 kg)   BMI 19.57 kg/m  Vitals:   11/27/19 1553  BP: 115/73  Pulse: 60  Temp: (!) 96.8 F (36 C)  TempSrc: Temporal  Weight: 117 lb 9.6 oz (53.3 kg)  Body mass index is 19.57 kg/m.   Physical Exam Constitutional:      Appearance: Normal appearance.  Cardiovascular:     Rate and Rhythm: Normal rate and regular rhythm.     Heart sounds: Normal heart sounds.  Pulmonary:     Effort: Pulmonary effort is normal.     Breath sounds: Normal breath sounds.  Abdominal:     General: Bowel sounds are normal.     Palpations: Abdomen is soft.     Tenderness: There is no abdominal tenderness.  Skin:    General: Skin is warm and dry.  Neurological:     Mental Status: She is alert and oriented to person, place, and time. Mental status is at baseline.  Psychiatric:        Mood and Affect: Mood normal.        Behavior: Behavior normal.      No results found for any visits on 11/27/19.     Assessment & Plan    1.  Epigastric pain  Advised to start Pepcid 20 mg twice daily. Take it 30 minutes before a meal on an empty stomach. Will refer to GI due to age and new onset reflux symptoms.   - Ambulatory referral to Gastroenterology - famotidine (PEPCID) 20 MG tablet; Take 1 tablet (20 mg total) by mouth 2 (two) times daily.  Dispense: 180 tablet; Refill: 0  The entirety of the information documented in the History of Present Illness, Review of Systems and Physical Exam were personally obtained by me. Portions of this information were initially documented by [facility] and reviewed by me for thoroughness and accuracy.       Trinna Post, PA-C  [facility]

## 2019-11-27 NOTE — Telephone Encounter (Signed)
Patient was advised and schedule appointment for today, 11/27/2019 @ 3:20 PM.

## 2019-11-27 NOTE — Telephone Encounter (Signed)
Patient states that she needs the referral due to having chest pain, belch,and burping. Advised patient that she may have to schedule appointment before referral is placed and patient just wants to have a referral placed. Please advise.

## 2019-11-27 NOTE — Patient Instructions (Signed)
Famotidine 20 mg BID    Esophagitis  Esophagitis is inflammation of the esophagus. The esophagus is the tube that carries food from your mouth to your stomach. Esophagitis can cause soreness or pain in the esophagus. This condition can make it difficult and painful to swallow. What are the causes? Most causes of esophagitis are not serious. Common causes of this condition include:  Gastroesophageal reflux disease (GERD). This is when stomach contents move back up into the esophagus (reflux).  Repeated vomiting.  An allergic reaction, especially caused by food allergies (eosinophilic esophagitis).  Injury to the esophagus by swallowing large pills with or without water, or swallowing certain types of medicines.  Swallowing (ingesting) harmful chemicals, such as household cleaning products.  Heavy alcohol use.  An infection of the esophagus. This most often occurs in people who have a weakened immune system.  Radiation or chemotherapy treatment for cancer.  Certain diseases such as sarcoidosis, Crohn's disease, and scleroderma. What are the signs or symptoms? Symptoms of this condition include:  Difficult or painful swallowing.  Pain with swallowing acidic liquids, such as citrus juices.  Pain with burping.  Chest pain.  Difficulty breathing.  Nausea.  Vomiting.  Pain in the abdomen.  Weight loss.  Ulcers in the mouth.  Patches of white material in the mouth (candidiasis).  Fever.  Coughing up blood or vomiting blood.  Stool that is black, tarry, or bright red. How is this diagnosed? Your health care provider will take a medical history and perform a physical exam. You may also have other tests, including:  An endoscopy to examine your esophagus and stomach with a small flexible tube with a camera.  A test that measures the acidity level in your esophagus.  A test that measures how much pressure is on your esophagus.  A barium swallow or modified barium  swallow to show the shape, size, and functioning of your esophagus.  Allergy tests. How is this treated? Treatment for this condition depends on the cause of your esophagitis. In some cases, steroids or other medicines may be given to help relieve your symptoms or to treat the underlying cause of your condition. You may have to make some lifestyle changes, such as:  Avoiding alcohol.  Quitting smoking.  Changing your diet.  Exercising.  Changing your sleep habits and your sleep environment. Follow these instructions at home: Medicines  Take over-the-counter and prescription medicines only as told by your health care provider.  Do not take aspirin, ibuprofen, or other NSAIDs unless your health care provider told you to do so.  If you have trouble taking pills: ? Use a pill splitter to decrease the size of the pill. This will decrease the chance of the pill getting stuck or injuring your esophagus. ? Drink water after you take a pill. Eating and drinking   Avoid foods and drinks that seem to make your symptoms worse.  Follow a diet as recommended by your health care provider. This may involve avoiding foods and drinks such as: ? Coffee and tea (with or without caffeine). ? Drinks that contain alcohol. ? Energy drinks and sports drinks. ? Carbonated drinks or sodas. ? Chocolate and cocoa. ? Peppermint and mint flavorings. ? Garlic and onions. ? Horseradish. ? Spicy and acidic foods, including peppers, chili powder, curry powder, vinegar, hot sauces, and barbecue sauce. ? Citrus fruit juices and citrus fruits, such as oranges, lemons, and limes. ? Tomato-based foods, such as red sauce, chili, salsa, and pizza with red sauce. ?  Fried and fatty foods, such as donuts, french fries, potato chips, and high-fat dressings. ? High-fat meats, such as hot dogs and fatty cuts of red and white meats, such as rib eye steak, sausage, ham, and bacon. ? High-fat dairy items, such as whole  milk, butter, and cream cheese. Lifestyle  Eat small, frequent meals instead of large meals.  Avoid drinking large amounts of liquid with your meals.  Avoid eating meals during the 2-3 hours before bedtime.  Avoid lying down right after you eat.  Do not exercise right after you eat.  Do not use any products that contain nicotine or tobacco, such as cigarettes and e-cigarettes. If you need help quitting, ask your health care provider. General instructions   Pay attention to any changes in your symptoms. Let your health care provider know about them.  Wear loose-fitting clothing. Do not wear anything tight around your waist that causes pressure on your abdomen.  Raise (elevate) the head of your bed about 6 inches (15 cm).  Try relaxation strategies such as yoga, deep breathing, or meditation to manage stress. If you need help reducing stress, ask your health care provider.  If you are overweight, reduce your weight to an amount that is healthy for you. Ask your health care provider for guidance about a safe weight loss goal.  Keep all follow-up visits as told by your health care provider. This is important. Contact a health care provider if:  You have new symptoms.  You have unexplained weight loss.  You have difficulty swallowing, or it hurts to swallow.  You have wheezing or a cough that does not go away.  Your symptoms do not improve with treatment.  You have frequent heartburn for more than two weeks. Get help right away if:  You have severe pain in your arms, neck, jaw, teeth, or back.  You feel sweaty, dizzy, or light-headed.  You have chest pain or shortness of breath.  You vomit and your vomit looks like blood or coffee grounds.  Your stool is bloody or black.  You have a fever.  You cannot swallow, drink, or eat. Summary  Esophagitis is inflammation of the esophagus.  Most causes of esophagitis are not serious.  Follow your health care provider's  instructions about eating and drinking. Follow instructions on medicines.  Contact a health care provider if you have new symptoms, have weight loss, or coughing that does not stop.  Get help right away if you have severe pain in the arms, neck, jaw, teeth or back, or if you have chest pain, shortness of breath, or fever. This information is not intended to replace advice given to you by your health care provider. Make sure you discuss any questions you have with your health care provider. Document Revised: 05/02/2018 Document Reviewed: 05/02/2018 Elsevier Patient Education  [place].

## 2019-12-05 DIAGNOSIS — Z23 Encounter for immunization: Secondary | ICD-10-CM | POA: Diagnosis not present

## 2019-12-23 ENCOUNTER — Ambulatory Visit: Payer: 59 | Admitting: Gastroenterology

## 2019-12-24 DIAGNOSIS — Z23 Encounter for immunization: Secondary | ICD-10-CM | POA: Diagnosis not present

## 2020-01-04 ENCOUNTER — Telehealth: Payer: Self-pay | Admitting: Physician Assistant

## 2020-01-04 DIAGNOSIS — M25559 Pain in unspecified hip: Secondary | ICD-10-CM

## 2020-01-04 NOTE — Telephone Encounter (Signed)
Patient is requesting a referral for her hip pain.  Patient states she has seen PCP regarding this. Patient states she has been having this pain for 3 months now and would like a referral to figure out more.  Call back [phone]

## 2020-01-04 NOTE — Telephone Encounter (Signed)
Tired calling patient to advice her that ortho referral was place and no answer. Left message for patient to return call, if patient calls back ok for [facility]EC to advise patient.

## 2020-01-04 NOTE — Telephone Encounter (Signed)
Ortho referral placed

## 2020-01-04 NOTE — Telephone Encounter (Signed)
Pt returned call, advised per message below. Pt would like to know the name of the location that will be calling her? Pt says that she be waiting for a call from Ortho

## 2020-01-05 NOTE — Telephone Encounter (Signed)
Called and spoke with patient and advised her that the referral coordinator would call to schedule an appointment based on her preference. She gave verbal understanding.

## 2020-01-11 DIAGNOSIS — M7062 Trochanteric bursitis, left hip: Secondary | ICD-10-CM | POA: Diagnosis not present

## 2020-02-03 DIAGNOSIS — C441122 Basal cell carcinoma of skin of right lower eyelid, including canthus: Secondary | ICD-10-CM | POA: Diagnosis not present

## 2020-02-18 ENCOUNTER — Other Ambulatory Visit: Payer: Self-pay | Admitting: Physician Assistant

## 2020-02-18 DIAGNOSIS — R1013 Epigastric pain: Secondary | ICD-10-CM

## 2020-02-23 DIAGNOSIS — M7062 Trochanteric bursitis, left hip: Secondary | ICD-10-CM | POA: Diagnosis not present

## 2020-03-08 DIAGNOSIS — H8103 Meniere's disease, bilateral: Secondary | ICD-10-CM | POA: Diagnosis not present

## 2020-03-08 DIAGNOSIS — H903 Sensorineural hearing loss, bilateral: Secondary | ICD-10-CM | POA: Diagnosis not present

## 2020-03-21 ENCOUNTER — Ambulatory Visit (INDEPENDENT_AMBULATORY_CARE_PROVIDER_SITE_OTHER): Payer: Medicare Other | Admitting: Physician Assistant

## 2020-03-21 ENCOUNTER — Other Ambulatory Visit: Payer: Self-pay

## 2020-03-21 ENCOUNTER — Encounter: Payer: Self-pay | Admitting: Physician Assistant

## 2020-03-21 VITALS — BP 121/69 | HR 61 | Temp 97.9°F | Resp 16 | Ht 65.0 in | Wt 119.0 lb

## 2020-03-21 DIAGNOSIS — Z1231 Encounter for screening mammogram for malignant neoplasm of breast: Secondary | ICD-10-CM

## 2020-03-21 DIAGNOSIS — E785 Hyperlipidemia, unspecified: Secondary | ICD-10-CM | POA: Diagnosis not present

## 2020-03-21 DIAGNOSIS — H903 Sensorineural hearing loss, bilateral: Secondary | ICD-10-CM

## 2020-03-21 DIAGNOSIS — Z1159 Encounter for screening for other viral diseases: Secondary | ICD-10-CM

## 2020-03-21 NOTE — Progress Notes (Signed)
Patient: Christina Parsons, Female    DOB: 1946-01-29, 74 y.o.   MRN: [id number] Visit Date: 03/21/2020  Today's Provider: Trinna Post, PA-C   Chief Complaint  Patient presents with  . Follow-up   Subjective    Christina Parsons is a 74 y.o. female who presents today for her followup She reports consuming a general diet. Gym/ health club routine includes cardio and walking on track . She generally feels fairly well. She reports sleeping fairly well. She does have additional problems to discuss today.   History of hyperlipidemia on 2017 labs with previous PCP.   Followed by [facility] for meniere's, has been offered cochlear implant but she is still thinking about it.   Had trochanteric bursitis but was treated with exercises and it is 90% resolved. Seen by [facility].   Acid reflux resolved after vomiting episode. No longer taking pepcid.   Rerpots left ear pain and PND. Concerned about infection.      Medications: Outpatient Medications Prior to Visit  Medication Sig  . Biotin 1 MG CAPS biotin  . Calcium-Magnesium-Vitamin D (CALCIUM 500 PO) Take by mouth daily.   . Multiple Vitamins-Minerals (OCUVITE EXTRA PO) Take by mouth.  . triamterene-hydrochlorothiazide (DYAZIDE) 37.5-25 MG capsule Take by mouth.  . famotidine (PEPCID) 20 MG tablet TAKE 1 TABLET BY MOUTH TWICE A DAY   No facility-administered medications prior to visit.    Allergies  Allergen Reactions  . Levofloxacin     Other reaction(s): Other (See Comments) Headache, body aches, diarrhea Headache, body aches, diarrhea   . Codeine     Other reaction(s): Dizziness, Dizziness (intolerance) Vertigo Vertigo     Patient Care Team: Paulene Floor as PCP - General (Physician Assistant)  Review of Systems  Constitutional: Negative.   HENT: Negative.   Eyes: Negative.   Respiratory: Negative.   Cardiovascular: Negative.   Gastrointestinal: Negative.   Endocrine: Negative.     Genitourinary: Negative.   Musculoskeletal: Negative.   Skin: Negative.   Allergic/Immunologic: Positive for environmental allergies.  Neurological: Negative.   Hematological: Negative.   Psychiatric/Behavioral: Negative.       Objective    Vitals: BP 121/69   Pulse 61   Temp 97.9 F (36.6 C)   Resp 16   Ht 5\' 5"  (1.651 m)   Wt 119 lb (54 kg)   BMI 19.80 kg/m    Physical Exam Constitutional:      Appearance: Normal appearance.  HENT:     Right Ear: Tympanic membrane normal.     Left Ear: Tympanic membrane normal.     Nose: Nose normal.     Mouth/Throat:     Pharynx: Oropharynx is clear.  Eyes:     Pupils: Pupils are equal, round, and reactive to light.  Cardiovascular:     Rate and Rhythm: Normal rate and regular rhythm.     Pulses: Normal pulses.     Heart sounds: Normal heart sounds.  Pulmonary:     Effort: Pulmonary effort is normal.     Breath sounds: Normal breath sounds.  Musculoskeletal:     Cervical back: Normal range of motion and neck supple.  Skin:    General: Skin is warm and dry.  Neurological:     Mental Status: She is alert and oriented to person, place, and time. Mental status is at baseline.  Psychiatric:        Mood and Affect: Mood normal.  Behavior: Behavior normal.      Most recent functional status assessment: In your present state of health, do you have any difficulty performing the following activities: 03/21/2020  Hearing? N  Vision? N  Difficulty concentrating or making decisions? N  Walking or climbing stairs? N  Dressing or bathing? N  Doing errands, shopping? N  Some recent data might be hidden   Most recent fall risk assessment: Fall Risk  03/21/2020  Falls in the past year? 0  Number falls in past yr: 0  Injury with Fall? 0  Follow up Falls evaluation completed    Most recent depression screenings: PHQ 2/9 Scores 03/21/2020 03/19/2019  PHQ - 2 Score 0 0  PHQ- 9 Score - 0   Most recent cognitive  screening: No flowsheet data found. Most recent Audit-C alcohol use screening Alcohol Use Disorder Test (AUDIT) 03/21/2020  1. How often do you have a drink containing alcohol? 0  2. How many drinks containing alcohol do you have on a typical day when you are drinking? 0  3. How often do you have six or more drinks on one occasion? 0  AUDIT-C Score 0  Alcohol Brief Interventions/Follow-up AUDIT Score <7 follow-up not indicated   A score of 3 or more in women, and 4 or more in men indicates increased risk for alcohol abuse, EXCEPT if all of the points are from question 1   No results found for any visits on 03/21/20.  Assessment & Plan     1. Hyperlipidemia, unspecified hyperlipidemia type  - CBC with Differential/Platelet - Comprehensive metabolic panel - Lipid panel  2. Encounter for screening mammogram for malignant neoplasm of breast - MM Digital Screening; Future  3. Need for hepatitis C screening test  - Hepatitis C antibody  4. Sensorineural hearing loss of both ears   Reviewed patient's Family Medical History Reviewed and updated list of patient's medical providers Assessment of cognitive impairment was done Assessed patient's functional ability Established a written schedule for health screening [place] Completed and Reviewed  Exercise Activities and Dietary recommendations Goals   None     Immunization History  Administered Date(s) Administered  . PFIZER SARS-COV-2 Vaccination 12/05/2019, 12/24/2019    Health Maintenance  Topic Date Due  . Hepatitis C Screening  Never done  . COLONOSCOPY  Never done  . TETANUS/TDAP  03/21/2021 (Originally 08/25/1965)  . PNA vac Low Risk Adult (1 of 2 - PCV13) 03/21/2021 (Originally 08/24/2013)  . INFLUENZA VACCINE  04/24/2020  . MAMMOGRAM  05/03/2021  . DEXA SCAN  Completed  . COVID-19 Vaccine  Completed     Discussed health benefits of physical activity, and encouraged her to engage in  regular exercise appropriate for her age and condition.    1. Hyperlipidemia, unspecified hyperlipidemia type  Requesting colonoscopy today. Pneumonia vaccine and shingles vaccines declined. Mammo ordered.   - CBC with Differential/Platelet - Comprehensive metabolic panel - Lipid panel  2. Encounter for screening mammogram for malignant neoplasm of breast  - MM Digital Screening; Future  3. Need for hepatitis C screening test  - Hepatitis C antibody  4. Sensorineural hearing loss of both ears  I have spent 25 minutes with this patient, >50% of which was spent on counseling and coordination of care.    Return in about 1 year (around 03/21/2021).     ITrinna Post, PA-C, have reviewed all documentation for this visit. The documentation on 03/21/20 for the exam, diagnosis, procedures, and orders  are all accurate and complete.    Paulene Floor  [facility] [phone] (phone) [phone] (fax)  [place]

## 2020-03-22 LAB — CBC WITH DIFFERENTIAL/PLATELET
Basophils Absolute: 0.1 10*3/uL (ref 0.0–0.2)
Basos: 1 %
EOS (ABSOLUTE): 0.1 10*3/uL (ref 0.0–0.4)
Eos: 2 %
Hematocrit: 39.1 % (ref 34.0–46.6)
Hemoglobin: 12.8 g/dL (ref 11.1–15.9)
Immature Grans (Abs): 0 10*3/uL (ref 0.0–0.1)
Immature Granulocytes: 0 %
Lymphocytes Absolute: 1.8 10*3/uL (ref 0.7–3.1)
Lymphs: 25 %
MCH: 29.2 pg (ref 26.6–33.0)
MCHC: 32.7 g/dL (ref 31.5–35.7)
MCV: 89 fL (ref 79–97)
Monocytes Absolute: 0.4 10*3/uL (ref 0.1–0.9)
Monocytes: 5 %
Neutrophils Absolute: 4.7 10*3/uL (ref 1.4–7.0)
Neutrophils: 67 %
Platelets: 154 10*3/uL (ref 150–450)
RBC: 4.39 x10E6/uL (ref 3.77–5.28)
RDW: 12.4 % (ref 11.7–15.4)
WBC: 7.1 10*3/uL (ref 3.4–10.8)

## 2020-03-22 LAB — COMPREHENSIVE METABOLIC PANEL
ALT: 9 IU/L (ref 0–32)
AST: 18 IU/L (ref 0–40)
Albumin/Globulin Ratio: 1.6 (ref 1.2–2.2)
Albumin: 4.5 g/dL (ref 3.7–4.7)
Alkaline Phosphatase: 74 IU/L (ref 48–121)
BUN/Creatinine Ratio: 17 (ref 12–28)
BUN: 21 mg/dL (ref 8–27)
Bilirubin Total: 0.9 mg/dL (ref 0.0–1.2)
CO2: 25 mmol/L (ref 20–29)
Calcium: 10.2 mg/dL (ref 8.7–10.3)
Chloride: 99 mmol/L (ref 96–106)
Creatinine, Ser: 1.27 mg/dL — ABNORMAL HIGH (ref 0.57–1.00)
GFR calc Af Amer: 49 mL/min/{1.73_m2} — ABNORMAL LOW (ref >59)
GFR calc non Af Amer: 43 mL/min/{1.73_m2} — ABNORMAL LOW (ref >59)
Globulin, Total: 2.9 g/dL (ref 1.5–4.5)
Glucose: 85 mg/dL (ref 65–99)
Potassium: 3.7 mmol/L (ref 3.5–5.2)
Sodium: 138 mmol/L (ref 134–144)
Total Protein: 7.4 g/dL (ref 6.0–8.5)

## 2020-03-22 LAB — LIPID PANEL
Chol/HDL Ratio: 3 ratio (ref 0.0–4.4)
Cholesterol, Total: 208 mg/dL — ABNORMAL HIGH (ref 100–199)
HDL: 70 mg/dL (ref >39)
LDL Chol Calc (NIH): 116 mg/dL — ABNORMAL HIGH (ref 0–99)
Triglycerides: 124 mg/dL (ref 0–149)
VLDL Cholesterol Cal: 22 mg/dL (ref 5–40)

## 2020-03-22 LAB — HEPATITIS C ANTIBODY: Hep C Virus Ab: <0.1 s/co ratio (ref 0.0–0.9)

## 2020-03-24 ENCOUNTER — Encounter: Payer: Self-pay | Admitting: Physician Assistant

## 2020-03-25 ENCOUNTER — Encounter: Payer: Self-pay | Admitting: Physician Assistant

## 2020-07-04 ENCOUNTER — Encounter: Payer: Self-pay | Admitting: Physician Assistant

## 2020-10-14 ENCOUNTER — Telehealth: Payer: Self-pay | Admitting: Physician Assistant

## 2020-10-14 NOTE — Telephone Encounter (Signed)
Copied from [place] [id number]. Topic: Medicare AWV >> Oct 14, 2020 11:36 AM Cher Nakai R wrote: Reason for CRM:   Left message for patient to call back and schedule Medicare Annual Wellness Visit (AWV) in office.   If not able to come in office, please offer to do virtually or by telephone.   Last AWV 02/21/2018 per [facility]  Please schedule at anytime with [facility] Health Advisor.  If any questions, please contact me at [phone]

## 2020-12-12 ENCOUNTER — Telehealth: Payer: Self-pay | Admitting: Physician Assistant

## 2020-12-12 NOTE — Telephone Encounter (Signed)
Copied from [place] [id number]. Topic: Medicare AWV >> Dec 12, 2020 11:40 AM Cher Nakai R wrote: Reason for CRM:  No answer unable to leave  message for patient to call back and schedule Medicare Annual Wellness Visit (AWV) in office.   If not able to come in office, please offer to do virtually or by telephone.   Last AWV: 02/21/2017  Please schedule at anytime with [facility] Health Advisor.  If any questions, please contact me at [phone]
